# Patient Record
Sex: Male | Born: 1995 | Race: White | Hispanic: No | Marital: Single
Health system: Southern US, Community
[De-identification: ages and names within clinical notes are randomized; demographics above are authoritative.]

## PROBLEM LIST (undated history)

## (undated) DIAGNOSIS — F909 Attention-deficit hyperactivity disorder, unspecified type: Secondary | ICD-10-CM

---
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
## 2022-03-10 ENCOUNTER — Ambulatory Visit (HOSPITAL_COMMUNITY)
Admission: EM | Admit: 2022-03-10 | Discharge: 2022-03-11 | Disposition: A | Discharge status: Other Acute Inpatient Hospital | Payer: Medicare Other | Attending: Family Medicine | Admitting: Family Medicine

## 2022-03-10 ENCOUNTER — Encounter (HOSPITAL_COMMUNITY): Payer: Self-pay | Admitting: Emergency Medicine

## 2022-03-10 DIAGNOSIS — F209 Schizophrenia, unspecified: Secondary | ICD-10-CM | POA: Diagnosis not present

## 2022-03-10 DIAGNOSIS — Z79899 Other long term (current) drug therapy: Secondary | ICD-10-CM | POA: Insufficient documentation

## 2022-03-10 DIAGNOSIS — F29 Unspecified psychosis not due to a substance or known physiological condition: Secondary | ICD-10-CM

## 2022-03-10 DIAGNOSIS — G4700 Insomnia, unspecified: Secondary | ICD-10-CM | POA: Insufficient documentation

## 2022-03-10 DIAGNOSIS — F1010 Alcohol abuse, uncomplicated: Secondary | ICD-10-CM | POA: Insufficient documentation

## 2022-03-10 DIAGNOSIS — F1210 Cannabis abuse, uncomplicated: Secondary | ICD-10-CM | POA: Diagnosis not present

## 2022-03-10 DIAGNOSIS — Z1152 Encounter for screening for COVID-19: Secondary | ICD-10-CM | POA: Diagnosis not present

## 2022-03-10 DIAGNOSIS — R001 Bradycardia, unspecified: Secondary | ICD-10-CM | POA: Insufficient documentation

## 2022-03-10 DIAGNOSIS — Z046 Encounter for general psychiatric examination, requested by authority: Secondary | ICD-10-CM | POA: Diagnosis present

## 2022-03-10 LAB — CBC WITH DIFFERENTIAL/PLATELET
Abs Immature Granulocytes: 0.08 10*3/uL — ABNORMAL HIGH (ref 0.00–0.07)
Basophils Absolute: 0 10*3/uL (ref 0.0–0.1)
Basophils Relative: 0 %
Eosinophils Absolute: 0.1 10*3/uL (ref 0.0–0.5)
Eosinophils Relative: 1 %
HCT: 47 % (ref 39.0–52.0)
Hemoglobin: 16 g/dL (ref 13.0–17.0)
Immature Granulocytes: 1 %
Lymphocytes Relative: 15 %
Lymphs Abs: 1.5 10*3/uL (ref 0.7–4.0)
MCH: 31 pg (ref 26.0–34.0)
MCHC: 34 g/dL (ref 30.0–36.0)
MCV: 91.1 fL (ref 80.0–100.0)
Monocytes Absolute: 0.6 10*3/uL (ref 0.1–1.0)
Monocytes Relative: 6 %
Neutro Abs: 8.1 10*3/uL — ABNORMAL HIGH (ref 1.7–7.7)
Neutrophils Relative %: 77 %
Platelets: 275 10*3/uL (ref 150–400)
RBC: 5.16 MIL/uL (ref 4.22–5.81)
RDW: 11.6 % (ref 11.5–15.5)
WBC: 10.4 10*3/uL (ref 4.0–10.5)
nRBC: 0 % (ref 0.0–0.2)

## 2022-03-10 LAB — COMPREHENSIVE METABOLIC PANEL
ALT: 34 U/L (ref 0–44)
AST: 26 U/L (ref 15–41)
Albumin: 4.5 g/dL (ref 3.5–5.0)
Alkaline Phosphatase: 50 U/L (ref 38–126)
Anion gap: 14 (ref 5–15)
BUN: 10 mg/dL (ref 6–20)
CO2: 23 mmol/L (ref 22–32)
Calcium: 9.9 mg/dL (ref 8.9–10.3)
Chloride: 102 mmol/L (ref 98–111)
Creatinine, Ser: 1.1 mg/dL (ref 0.61–1.24)
GFR, Estimated: >60 mL/min (ref >60)
Glucose, Bld: 87 mg/dL (ref 70–99)
Potassium: 3.8 mmol/L (ref 3.5–5.1)
Sodium: 139 mmol/L (ref 135–145)
Total Bilirubin: 1.1 mg/dL (ref 0.3–1.2)
Total Protein: 7.3 g/dL (ref 6.5–8.1)

## 2022-03-10 LAB — POC SARS CORONAVIRUS 2 AG: SARSCOV2ONAVIRUS 2 AG: NEGATIVE

## 2022-03-10 LAB — LIPID PANEL
Cholesterol: 168 mg/dL (ref 0–200)
HDL: 41 mg/dL (ref >40)
LDL Cholesterol: 111 mg/dL — ABNORMAL HIGH (ref 0–99)
Total CHOL/HDL Ratio: 4.1 RATIO
Triglycerides: 78 mg/dL (ref <150)
VLDL: 16 mg/dL (ref 0–40)

## 2022-03-10 LAB — HEMOGLOBIN A1C
Hgb A1c MFr Bld: 5.2 % (ref 4.8–5.6)
Mean Plasma Glucose: 102.54 mg/dL

## 2022-03-10 LAB — RESP PANEL BY RT-PCR (RSV, FLU A&B, COVID)  RVPGX2
Influenza A by PCR: NEGATIVE
Influenza B by PCR: NEGATIVE
Resp Syncytial Virus by PCR: NEGATIVE
SARS Coronavirus 2 by RT PCR: NEGATIVE

## 2022-03-10 LAB — TSH: TSH: 4.825 u[IU]/mL — ABNORMAL HIGH (ref 0.350–4.500)

## 2022-03-10 LAB — ETHANOL: Alcohol, Ethyl (B): <10 mg/dL (ref <10)

## 2022-03-10 MED ORDER — MAGNESIUM HYDROXIDE 400 MG/5ML PO SUSP: 30.0000 mL | Freq: Every day | ORAL | Status: DC | PRN

## 2022-03-10 MED ORDER — OLANZAPINE 5 MG PO TBDP
5.0000 mg | ORAL_TABLET | Freq: Two times a day (BID) | ORAL | Status: DC
  Administered 2022-03-10 – 2022-03-11 (×3): 5 mg via ORAL
  Filled 2022-03-10 (×3): qty 1

## 2022-03-10 MED ORDER — TRAZODONE HCL 50 MG PO TABS
50.0000 mg | ORAL_TABLET | Freq: Every evening | ORAL | Status: DC | PRN
  Filled 2022-03-10: qty 1

## 2022-03-10 MED ORDER — ACETAMINOPHEN 325 MG PO TABS
650.0000 mg | ORAL_TABLET | Freq: Four times a day (QID) | ORAL | Status: DC | PRN
  Administered 2022-03-11: 650 mg via ORAL
  Filled 2022-03-10: qty 2

## 2022-03-10 MED ORDER — FAMOTIDINE 10 MG PO TABS
10.0000 mg | ORAL_TABLET | Freq: Two times a day (BID) | ORAL | Status: DC
  Administered 2022-03-10 – 2022-03-11 (×3): 10 mg via ORAL
  Filled 2022-03-10 (×3): qty 1

## 2022-03-10 MED ORDER — LORAZEPAM 1 MG PO TABS: 1.0000 mg | ORAL_TABLET | Freq: Four times a day (QID) | ORAL | Status: DC | PRN

## 2022-03-10 MED ORDER — LORAZEPAM 2 MG/ML IJ SOLN: 2.0000 mg | Freq: Once | INTRAMUSCULAR | Status: DC | PRN

## 2022-03-10 MED ORDER — HYDROXYZINE HCL 25 MG PO TABS
25.0000 mg | ORAL_TABLET | Freq: Three times a day (TID) | ORAL | Status: DC | PRN
  Administered 2022-03-10 – 2022-03-11 (×3): 25 mg via ORAL
  Filled 2022-03-10 (×3): qty 1

## 2022-03-10 MED ORDER — ALUM & MAG HYDROXIDE-SIMETH 200-200-20 MG/5ML PO SUSP: 30.0000 mL | ORAL | Status: DC | PRN

## 2022-03-10 MED ORDER — ZIPRASIDONE MESYLATE 20 MG IM SOLR: 20.0000 mg | Freq: Once | INTRAMUSCULAR | Status: DC | PRN

## 2022-03-10 NOTE — ED Notes (Signed)
Pt sitting on bed calm and cooperative@this  time. No c/o pain or distress. Will continue to monitor for safety

## 2022-03-10 NOTE — ED Notes (Signed)
Pt is awake and alert. He is sitting up . Pt is complaining of some anxiety and was given prn vistaril as ordered.   He has also been given dinner and juice.  Staff will cont to monitor
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
## 2022-03-10 NOTE — ED Notes (Signed)
Covid resulted negative and he was brought onto the unit.  Vitals are currently being rechecked.  Marland Kitchen

## 2022-03-10 NOTE — ED Notes (Signed)
While obtaining blood specimen pt reported " I feel like I am about to faint".  Pt was sitting down in the lab chair and started sweating.  A cold compress was applied to back of neck.  No LOC noted.  Pt  was given ice water and stated that he was feeling better.   Vitals at that time were HR 125.  BP 93/63.  After pt felt better he was escorted back into 134.  He was given juice and reported that " I feel ok now".  Pt was then searched with no contraband found.

## 2022-03-10 NOTE — ED Notes (Addendum)
Entered in error

## 2022-03-10 NOTE — BH Assessment (Addendum)
Disposition Notes:  Per Molli Barrows, NP, patient meets criteria for inpatient psychiatric treatment. The [facility] (Antoinette Cillo, RN), confirmed that [facility] has no bed availability. Patient faxed to the following outside facilities for consideration of bed placement.     Destination  Service Provider Request Status Selected Services Address Phone Fax Patient Preferred  [place] [address] [place] [address] [place] N/A [address], [place] [address] [phone] --  [place] N/A [place], [place] [address] [phone] --  [place] N/A [address] [place] [address] [phone] --  [facility]  Pending - Request Sent N/A [address] [place] [address] [phone] --  [place] N/A [place], [place] [address] [phone] --  [place] [phone] [address] [place] [address] [place] --  [facility]  Pending - Request Sent N/A [address] [place], [place] [address] [phone] --  [facility]  Pending - Request Sent N/A [address], [place] [address] [phone] --  [facility]  Pending - Request Sent N/A [address] [place] [address] [phone] --  [facility]  Pending - Request Sent N/A [address], [place] [address] [phone] --  [facility]  Pending - Request Sent N/A [address] [place] [address] [phone] --  [place] N/A [address] [place] [address] [phone] --  [place]  Pending - Request Sent N/A [address], [place] [address] [phone] --  [facility]  Pending - Request Sent N/A [place], [place] [address] [phone] --  [facility]  Pending - Request Sent N/A [place]., [place] [address] [phone] --  [facility]  Pending - Request Sent N/A [address], [place] [address] [phone] --  [place] N/A [address], [place] [address] [phone] --  [facility]  Pending - Request Sent N/A [address] [place] [address] [phone] --  [facility]  Pending - Request Sent N/A [address], [place] [address] [phone] --  [facility]  Pending - Request Sent N/A [address] [place] [address] [phone] --  [place] N/A [address] [place] [address] [phone] --  [facility]  Pending - Request Sent N/A [address] [place] [address] [phone] --  [facility]  Pending - Request Sent N/A [address] [place] [address] [phone]
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
## 2022-03-10 NOTE — ED Notes (Signed)
Pt sitting up in bed. No distress noted
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
## 2022-03-11 DIAGNOSIS — F209 Schizophrenia, unspecified: Secondary | ICD-10-CM | POA: Diagnosis not present

## 2022-03-11 LAB — T4, FREE: Free T4: 1.12 ng/dL (ref 0.61–1.12)

## 2022-03-11 MED ORDER — OLANZAPINE 5 MG PO TBDP: 5.0000 mg | ORAL_TABLET | Freq: Two times a day (BID) | ORAL | Status: AC
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
## 2022-03-11 NOTE — ED Notes (Signed)
Pt resting at present, no distress noted.  Monitoring for safety.

## 2022-03-11 NOTE — BH Assessment (Signed)
Disposition Note:   @1004 , Received a call from staff "Domm" at [facility]. Patient has been accepted for admission to their hospital. The bed is ready today. The accepting provider is Dr. Ula Lingo. Nurse report # [phone]. [place] charge nurse (Wynelle Beckmann, RN) and [facility] provider Darrol Angel, NP) provided disposition updates.

## 2022-03-11 NOTE — ED Notes (Signed)
Report attempted to call again, no answer with [facility]

## 2022-03-11 NOTE — Discharge Instructions (Addendum)
Transfer to [facility]. Patient is under IVC.

## 2022-03-11 NOTE — ED Notes (Signed)
Pt sitting up quietly in bed. Sheriff [employer] called and stated that they would be here at 1230 to transport him.   [facility] called for report.
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
## 2022-03-11 NOTE — ED Notes (Signed)
[employer]iff [employer] called and message for transportation left at this time.

## 2022-03-11 NOTE — ED Notes (Signed)
Report given to [employer].  Report faxed

## 2022-03-11 NOTE — BH Assessment (Addendum)
The [facility] provider Darrol Angel, NP) continues to recommend inpatient treatment. Per am bed meeting the [facility] (Anotinette Cillo, RN) reported no [place] bed availability at this time. The [facility] provider requested this Clinician to re-fax patient out to facilities for consideration of bed placement. Referral packets were faxed out to several facilities for consideration of bed placement.    Destination  Service Provider Request Status Selected Services Address Phone Fax Patient Preferred  [place] [address], [place] [address] [place] N/A [address] [place] [address] [phone] --  [place] N/A [place], [place] [address] [phone] --  [place] N/A [address], [place] [address] [phone] --  [facility]  Pending - Request Sent N/A [address] [place] [address] [phone] --  [place] N/A [place], [place] [address] [phone] --  [place] [phone] [address] [place] [address] [place] --  [facility]  Pending - Request Sent N/A [address] [place], [place] [address] [phone] --  [facility]  Pending - Request Sent N/A [address], [place] [address] [phone] --  [facility]  Pending - Request Sent N/A [address] [place] [address] [phone] --  [facility]  Pending - Request Sent N/A [address], [place] [address] [phone] --  [facility]  Pending -  Request Sent N/A [address] [place] [address] [phone] --  [place] N/A [address] [place] [address] [phone] --  [place]  Pending - Request Sent N/A [address], [place] [address] [phone] --  [facility]  Pending - Request Sent N/A [place], [place] [address] [phone] --  [facility]  Pending - Request Sent N/A [place] [place] [address] [phone] --  [facility]  Pending - Request Sent N/A [address], [place] [address] [phone] --  [place] N/A [address] [place] [address], [place] [address] [phone] --  [facility]  Pending - Request Sent N/A [address] [place] [address] [phone] --  [facility]  Pending - Request Sent N/A [address] [place], [place] [address] [phone] --  [facility]  Pending - Request Sent N/A [address] [place] [address] [phone] --  [place] N/A [address] [place] [address] [phone] --  [facility]  Pending - Request Sent N/A [address] [place] [address] [phone] --  [facility]  Pending - Request Sent N/A [address] [place] [address] [phone]

## 2022-03-11 NOTE — BH Assessment (Addendum)
@  0092 , received a call from [facility] stating they received the referral packet for inpatient placement. However, they are at capacity and unable to accept patient for admission.

## 2022-03-11 NOTE — ED Notes (Signed)
Pt currently sleeping soundly.  Breathing even and unlabored.  Staff will continue to monitor for safety.  

## 2022-03-11 NOTE — ED Notes (Signed)
Pt sleeping@this time. Breathing even and unlabored. Will continue to monitor for safety 

## 2022-03-11 NOTE — ED Notes (Signed)
Multiple attempts have been made to reach [facility] Admissions, Charge Nurse or Floor nurse "Domm"   #s [phone], [phone], [phone] and beeper number [phone]  have all been called multiple times with no answer.   Sheriff has been here for transport.  Staff will continue to attempt to reach [facility].

## 2022-03-12 LAB — T3, FREE: T3, Free: 3.9 pg/mL (ref 2.0–4.4)

## 2022-03-14 LAB — PROLACTIN: Prolactin: 3.1 ng/mL — ABNORMAL LOW (ref 3.6–31.5)

## 2022-04-06 ENCOUNTER — Ambulatory Visit (HOSPITAL_COMMUNITY): Payer: Medicare Other | Admitting: Psychiatry

## 2022-05-01 ENCOUNTER — Ambulatory Visit (HOSPITAL_COMMUNITY): Payer: Medicare Other | Admitting: Psychiatry

## 2022-06-11 ENCOUNTER — Telehealth (HOSPITAL_COMMUNITY): Payer: Self-pay | Admitting: Family Medicine
